# Patient Record
Sex: Female | Born: 1965 | Hispanic: No | Marital: Married
Health system: Southern US, Community
[De-identification: ages and names within clinical notes are randomized; demographics above are authoritative.]

---

## 1997-06-14 ENCOUNTER — Emergency Department (HOSPITAL_COMMUNITY): Admission: EM | Admit: 1997-06-14 | Discharge: 1997-06-14 | Payer: Self-pay

## 1997-07-08 ENCOUNTER — Emergency Department (HOSPITAL_COMMUNITY): Admission: EM | Admit: 1997-07-08 | Discharge: 1997-07-08 | Payer: Self-pay | Admitting: Emergency Medicine

## 1998-04-25 ENCOUNTER — Other Ambulatory Visit: Admission: RE | Admit: 1998-04-25 | Discharge: 1998-04-25 | Payer: Self-pay | Admitting: Obstetrics and Gynecology

## 1998-09-15 ENCOUNTER — Emergency Department (HOSPITAL_COMMUNITY): Admission: EM | Admit: 1998-09-15 | Discharge: 1998-09-15 | Payer: Self-pay | Admitting: *Deleted

## 1999-02-21 ENCOUNTER — Encounter: Payer: Self-pay | Admitting: Emergency Medicine

## 1999-02-21 ENCOUNTER — Emergency Department (HOSPITAL_COMMUNITY): Admission: EM | Admit: 1999-02-21 | Discharge: 1999-02-21 | Payer: Self-pay | Admitting: Emergency Medicine

## 1999-05-04 ENCOUNTER — Other Ambulatory Visit: Admission: RE | Admit: 1999-05-04 | Discharge: 1999-05-04 | Payer: Self-pay | Admitting: Obstetrics and Gynecology

## 2000-07-07 ENCOUNTER — Other Ambulatory Visit: Admission: RE | Admit: 2000-07-07 | Discharge: 2000-07-07 | Payer: Self-pay | Admitting: *Deleted

## 2000-12-02 ENCOUNTER — Encounter: Payer: Self-pay | Admitting: Occupational Medicine

## 2000-12-02 ENCOUNTER — Encounter: Admission: RE | Admit: 2000-12-02 | Discharge: 2000-12-02 | Payer: Self-pay | Admitting: Occupational Medicine

## 2001-07-27 ENCOUNTER — Other Ambulatory Visit: Admission: RE | Admit: 2001-07-27 | Discharge: 2001-07-27 | Payer: Self-pay | Admitting: Obstetrics and Gynecology

## 2002-08-08 ENCOUNTER — Other Ambulatory Visit: Admission: RE | Admit: 2002-08-08 | Discharge: 2002-08-08 | Payer: Self-pay | Admitting: Obstetrics and Gynecology

## 2004-10-01 ENCOUNTER — Encounter: Admission: RE | Admit: 2004-10-01 | Discharge: 2004-10-01 | Payer: Self-pay | Admitting: Obstetrics & Gynecology

## 2004-10-01 ENCOUNTER — Ambulatory Visit (HOSPITAL_COMMUNITY): Admission: RE | Admit: 2004-10-01 | Discharge: 2004-10-01 | Payer: Self-pay | Admitting: Obstetrics & Gynecology

## 2004-11-10 ENCOUNTER — Encounter: Admission: RE | Admit: 2004-11-10 | Discharge: 2004-11-10 | Payer: Self-pay | Admitting: Obstetrics & Gynecology

## 2004-11-17 ENCOUNTER — Encounter: Admission: RE | Admit: 2004-11-17 | Discharge: 2004-11-17 | Payer: Self-pay | Admitting: Obstetrics & Gynecology

## 2004-12-08 ENCOUNTER — Encounter: Admission: RE | Admit: 2004-12-08 | Discharge: 2004-12-08 | Payer: Self-pay | Admitting: Obstetrics & Gynecology

## 2005-05-05 ENCOUNTER — Encounter: Admission: RE | Admit: 2005-05-05 | Discharge: 2005-05-05 | Payer: Self-pay | Admitting: Obstetrics & Gynecology

## 2005-11-29 ENCOUNTER — Ambulatory Visit (HOSPITAL_COMMUNITY): Admission: RE | Admit: 2005-11-29 | Discharge: 2005-11-29 | Payer: Self-pay | Admitting: Obstetrics & Gynecology

## 2006-06-09 ENCOUNTER — Encounter: Admission: RE | Admit: 2006-06-09 | Discharge: 2006-06-09 | Payer: Self-pay | Admitting: Interventional Radiology

## 2006-06-22 ENCOUNTER — Encounter: Admission: RE | Admit: 2006-06-22 | Discharge: 2006-06-22 | Payer: Self-pay | Admitting: Interventional Radiology

## 2006-07-13 ENCOUNTER — Encounter: Admission: RE | Admit: 2006-07-13 | Discharge: 2006-07-13 | Payer: Self-pay | Admitting: Interventional Radiology

## 2006-11-07 ENCOUNTER — Emergency Department (HOSPITAL_COMMUNITY): Admission: EM | Admit: 2006-11-07 | Discharge: 2006-11-07 | Payer: Self-pay | Admitting: Emergency Medicine

## 2006-11-16 ENCOUNTER — Encounter: Admission: RE | Admit: 2006-11-16 | Discharge: 2006-11-16 | Payer: Self-pay | Admitting: Interventional Radiology

## 2006-12-13 ENCOUNTER — Inpatient Hospital Stay (HOSPITAL_COMMUNITY): Admission: AD | Admit: 2006-12-13 | Discharge: 2006-12-13 | Payer: Self-pay | Admitting: Obstetrics and Gynecology

## 2006-12-14 ENCOUNTER — Ambulatory Visit (HOSPITAL_COMMUNITY): Admission: AD | Admit: 2006-12-14 | Discharge: 2006-12-14 | Payer: Self-pay | Admitting: Obstetrics and Gynecology

## 2006-12-14 ENCOUNTER — Encounter (INDEPENDENT_AMBULATORY_CARE_PROVIDER_SITE_OTHER): Payer: Self-pay | Admitting: Obstetrics and Gynecology

## 2007-08-11 ENCOUNTER — Inpatient Hospital Stay (HOSPITAL_COMMUNITY): Admission: AD | Admit: 2007-08-11 | Discharge: 2007-08-11 | Payer: Self-pay | Admitting: *Deleted

## 2007-08-12 ENCOUNTER — Inpatient Hospital Stay (HOSPITAL_COMMUNITY): Admission: AD | Admit: 2007-08-12 | Discharge: 2007-08-12 | Payer: Self-pay | Admitting: *Deleted

## 2008-03-27 IMAGING — US US INJEC SCLEROTHERAPY MULT
1 series · 3 of 3 positions shown · non-contrast
Comparison: none

CLINICAL DATA: Recurrent symptomatic right medial thigh and calf varicose veins
related to  incompetent perforators.

Ultrasound guided foam injection venous sclerotherapy:
TECHNIQUE: The course of the recurrent symptomatic varicose veins was marked on
the leg. A dense foam was generated using 1.5 mL sodium tetradecyl sulfate.
Under real-time ultrasound guidance, a 25-gauge needle was placed into select
segments of the recurrent varicose veins. The foam was administered in small
aliquots, ultimately shown to fill the target veins completely. Patient
tolerated procedure well, with no immediate complication.

[Series 1: us injec sclerotherapy mult · 0.06mm/px · 3 of 3 slices shown]
[im 1/3]
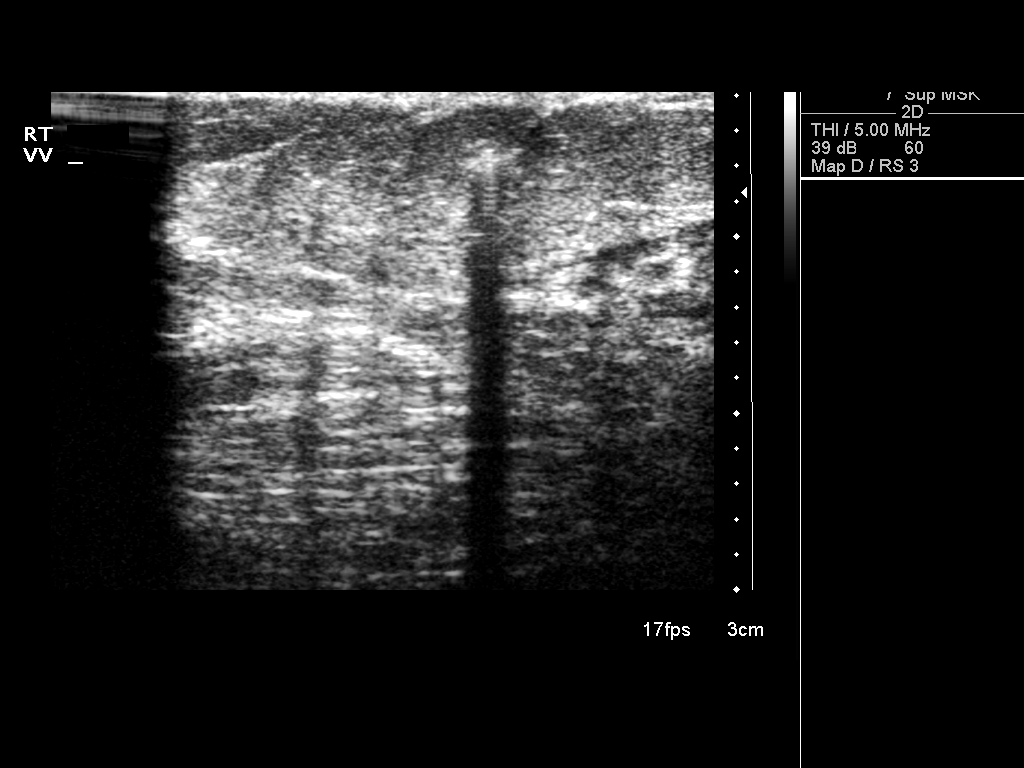
[im 2/3]
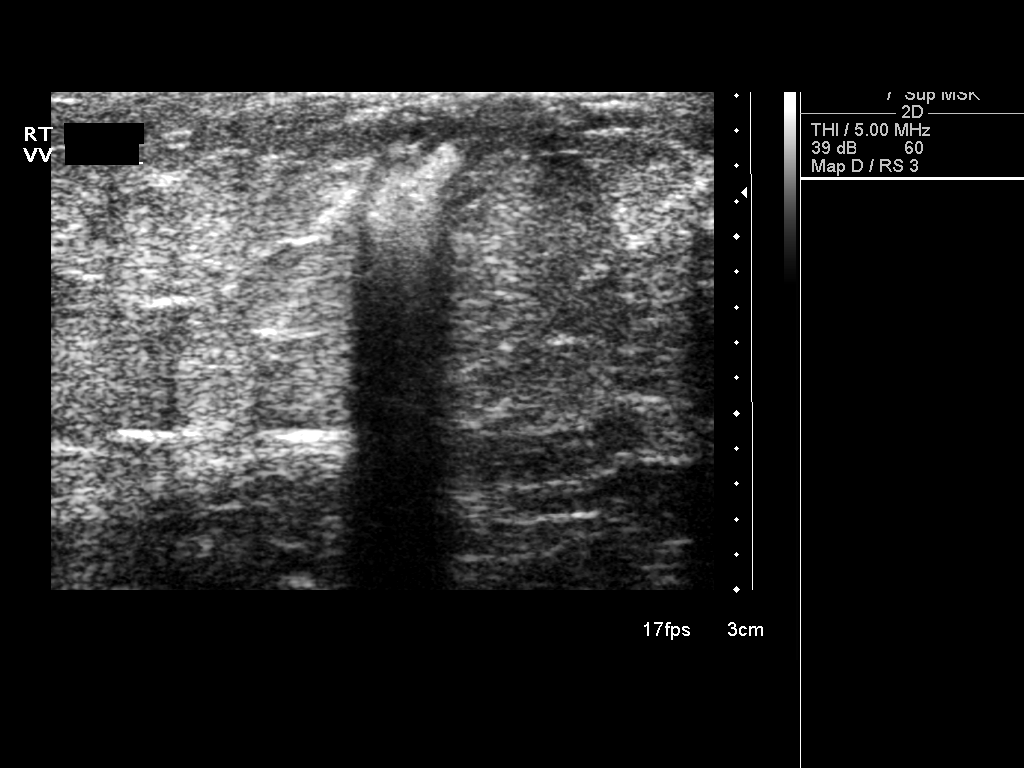
[im 3/3]
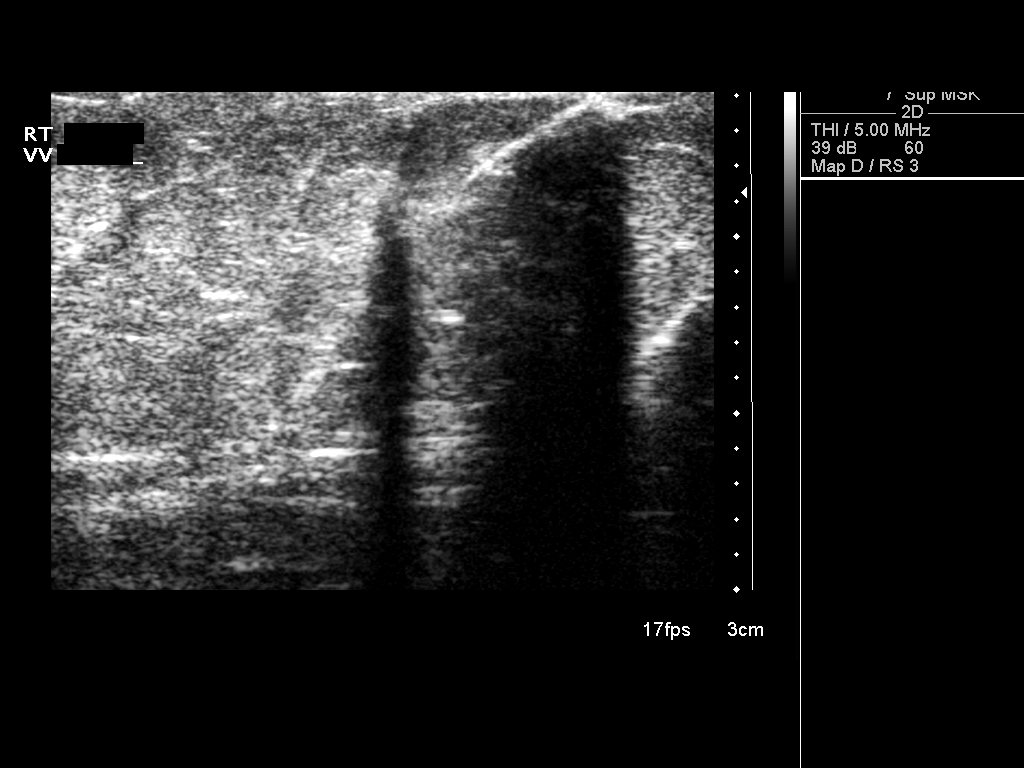

[3 of 3 positions shown; findings below may reference images not displayed]

IMPRESSION: 1. Technically successful ultrasound guided foam sclerotherapy of right lower
extremity varicose veins

## 2009-08-14 ENCOUNTER — Ambulatory Visit (HOSPITAL_COMMUNITY): Admission: RE | Admit: 2009-08-14 | Discharge: 2009-08-14 | Payer: Self-pay | Admitting: Obstetrics & Gynecology

## 2010-02-01 ENCOUNTER — Encounter: Payer: Self-pay | Admitting: Obstetrics & Gynecology

## 2010-02-01 ENCOUNTER — Encounter: Payer: Self-pay | Admitting: Interventional Radiology

## 2010-05-26 NOTE — Op Note (Signed)
[name], Melanie Estrada               ACCOUNT NO.:  [id number]   MEDICAL RECORD NO.:  [id number]          PATIENT TYPE:  AMB   LOCATION:  [facility]                          FACILITY:  [facility]   PHYSICIAN:  Maxie Better, M.D.DATE OF BIRTH:  1965-05-09   DATE OF PROCEDURE:  12/14/2006  DATE OF DISCHARGE:                               OPERATIVE REPORT   PREOPERATIVE DIAGNOSIS:  Incomplete spontaneous abortion.   PROCEDURE:  Suction dilation evacuation.   POSTOPERATIVE DIAGNOSES:  Incomplete spontaneous abortion, fibroid  uterus.   ANESTHESIA:  MAC, paracervical block.   SURGEON:  Maxie Better, M.D.   INDICATIONS:  A 45 year old para 80 female with a documented intrauterine  gestation times two ultrasounds with no fetal pole who presents with  increased vaginal bleeding and cramping.  On evaluation the patient has  tissue at the external os, which was removed.  The patient underwent an  ultrasound that still revealed a pregnancy in the uterus without any  heartbeat.  Given the findings the patient was counseled regarding the  option of continuing with her current symptoms or to proceed with a D&E.  The patient opted to proceed with a D&E.  The surgical risk was reviewed  with the patient.  Consent was signed.  The patient was transferred to  the operating room.   PROCEDURE:  Under adequate monitored anesthesia the patient was placed  in the dorsal lithotomy position.  She was sterilely prepped and draped  in the usual fashion.  The bladder was catheterized of a large amount of  urine.  Examination under anesthesia revealed about a 10 weeks size  irregular uterus.  A bivalve speculum was placed in the vagina, 20 mL of  1% Nesacaine was injected paracervical at 3 and 9 o'clock.  At the  cervical os there was tissue noted.  The anterior lip of the cervix was  grasped with a single-tooth tenaculum.  The tissue was removed.  The  cavity was curetted and suctioned.  Additional tissue was  then removed.  The cavity was irregular due to the fibroids.  The procedure was felt to  be complete and all instruments were then removed.  Specimen labeled  products of conception were sent to pathology.  Estimated blood loss was  minimal.  Complications were none.  The patient tolerated the procedure  well, was transferred to recovery in stable condition.      Maxie Better, M.D.  Electronically Signed     [place]/[facility]  D:  12/14/2006  T:  12/14/2006  Job:  [id number]

## 2010-07-17 ENCOUNTER — Other Ambulatory Visit (HOSPITAL_COMMUNITY): Payer: Self-pay | Admitting: Obstetrics & Gynecology

## 2010-07-17 DIAGNOSIS — Z1231 Encounter for screening mammogram for malignant neoplasm of breast: Secondary | ICD-10-CM

## 2010-08-21 ENCOUNTER — Ambulatory Visit (HOSPITAL_COMMUNITY)
Admission: RE | Admit: 2010-08-21 | Discharge: 2010-08-21 | Disposition: A | Discharge status: Home / Self Care | Payer: Federal, State, Local not specified - PPO | Source: Ambulatory Visit | Attending: Obstetrics & Gynecology | Admitting: Obstetrics & Gynecology

## 2010-08-21 DIAGNOSIS — Z1231 Encounter for screening mammogram for malignant neoplasm of breast: Secondary | ICD-10-CM | POA: Insufficient documentation

## 2010-10-09 LAB — KLEIHAUER-BETKE STAIN
Fetal Cells %: 0
Quantitation Fetal Hemoglobin: 0

## 2010-10-19 LAB — HCG, QUANTITATIVE, PREGNANCY
hCG, Beta Chain, Quant, S: 1062 — ABNORMAL HIGH
hCG, Beta Chain, Quant, S: 1173 — ABNORMAL HIGH

## 2010-10-19 LAB — CBC
HCT: 37.3
Platelets: 222
RDW: 14.4
WBC: 16.6 — ABNORMAL HIGH

## 2010-10-19 LAB — ABO/RH: ABO/RH(D): O POS

## 2017-08-10 ENCOUNTER — Emergency Department (HOSPITAL_COMMUNITY)
Admission: EM | Admit: 2017-08-10 | Discharge: 2017-08-11 | Disposition: A | Discharge status: Home / Self Care | Payer: No Typology Code available for payment source | Attending: Emergency Medicine | Admitting: Emergency Medicine

## 2017-08-10 ENCOUNTER — Encounter (HOSPITAL_COMMUNITY): Payer: Self-pay

## 2017-08-10 ENCOUNTER — Other Ambulatory Visit: Payer: Self-pay

## 2017-08-10 DIAGNOSIS — R519 Headache, unspecified: Secondary | ICD-10-CM

## 2017-08-10 DIAGNOSIS — M542 Cervicalgia: Secondary | ICD-10-CM | POA: Insufficient documentation

## 2017-08-10 DIAGNOSIS — R51 Headache: Secondary | ICD-10-CM | POA: Insufficient documentation
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
## 2017-08-11 ENCOUNTER — Emergency Department (HOSPITAL_COMMUNITY): Payer: No Typology Code available for payment source

## 2017-08-11 MED ORDER — LORAZEPAM 1 MG PO TABS
0.5000 mg | ORAL_TABLET | Freq: Once | ORAL | Status: AC
  Administered 2017-08-11: 0.5 mg via ORAL
  Filled 2017-08-11: qty 1

## 2017-08-11 MED ORDER — CYCLOBENZAPRINE HCL 10 MG PO TABS: 10.0000 mg | ORAL_TABLET | Freq: Two times a day (BID) | ORAL | 0 refills | Status: AC | PRN

## 2017-08-11 MED ORDER — ACETAMINOPHEN 325 MG PO TABS
650.0000 mg | ORAL_TABLET | Freq: Once | ORAL | Status: AC
Start: 2017-08-11 — End: 2017-08-11
  Administered 2017-08-11: 650 mg via ORAL
  Filled 2017-08-11: qty 2

## 2017-08-11 MED ORDER — NAPROXEN 250 MG PO TABS
375.0000 mg | ORAL_TABLET | Freq: Once | ORAL | Status: AC
  Administered 2017-08-11: 375 mg via ORAL
  Filled 2017-08-11: qty 2

## 2017-08-11 MED ORDER — NAPROXEN 375 MG PO TABS: 375.0000 mg | ORAL_TABLET | Freq: Two times a day (BID) | ORAL | 0 refills | Status: AC
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
## 2017-08-11 NOTE — ED Notes (Signed)
Pt ambulated without difficulty

## 2017-08-11 NOTE — ED Notes (Signed)
Patient transported to CT 
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
